# Patient Record
Sex: Female | Born: 1995 | Race: Black or African American | Hispanic: No | Marital: Single | Smoking: Current every day smoker
Health system: Southern US, Community
[De-identification: ages and names within clinical notes are randomized; demographics above are authoritative.]

---

## 2019-01-25 ENCOUNTER — Encounter (HOSPITAL_COMMUNITY): Payer: Self-pay | Admitting: *Deleted

## 2019-01-25 ENCOUNTER — Emergency Department (HOSPITAL_COMMUNITY)
Admission: EM | Admit: 2019-01-25 | Discharge: 2019-01-25 | Disposition: A | Discharge status: Home / Self Care | Payer: Self-pay | Attending: Emergency Medicine | Admitting: Emergency Medicine

## 2019-01-25 ENCOUNTER — Other Ambulatory Visit: Payer: Self-pay

## 2019-01-25 DIAGNOSIS — F17210 Nicotine dependence, cigarettes, uncomplicated: Secondary | ICD-10-CM | POA: Insufficient documentation

## 2019-01-25 DIAGNOSIS — T50901A Poisoning by unspecified drugs, medicaments and biological substances, accidental (unintentional), initial encounter: Secondary | ICD-10-CM | POA: Insufficient documentation

## 2019-01-25 DIAGNOSIS — F1210 Cannabis abuse, uncomplicated: Secondary | ICD-10-CM | POA: Insufficient documentation

## 2019-01-25 NOTE — ED Triage Notes (Signed)
Pt reports accidentally pouring her soda in a cup with antifreeze and drinking 2 sips of it today between 1500-1530.  She reports very mild abd pain without n/v.  She also endorses feeling "foggy."  She is A&Ox 4, in NAD.

## 2019-01-25 NOTE — ED Provider Notes (Signed)
[facility]-EMERGENCY DEPT Provider Note   CSN: [id number] Arrival date & time: 01/25/19  1645    History   Chief Complaint Chief Complaint  Patient presents with  . Ingestion    HPI Mary Ballard is a 23 y.o. female.     The history is provided by the patient.  Ingestion  This is a new problem. The current episode started 3 to 5 hours ago. The problem occurs rarely. The problem has been resolved. Pertinent negatives include no chest pain, no abdominal pain, no headaches and no shortness of breath. Associated symptoms comments: Accidentally may have drank soda with antifreeze in it. Took two sips of soda out of a cup that was used to fill antifreeze earlier in the day. . Nothing aggravates the symptoms. Nothing relieves the symptoms.    History reviewed. No pertinent past medical history.  There are no active problems to display for this patient.   History reviewed. No pertinent surgical history.   OB History   No obstetric history on file.      Home Medications    Prior to Admission medications   Not on File    Family History No family history on file.  Social History Social History   Tobacco Use  . Smoking status: Current Every Day Smoker    Packs/day: 0.50    Types: Cigarettes  . Smokeless tobacco: Never Used  Substance Use Topics  . Alcohol use: Yes    Comment: occasionally  . Drug use: Yes    Types: Marijuana    Comment: sometimes ecstasy     Allergies   Patient has no known allergies.   Review of Systems Review of Systems  Constitutional: Negative for chills and fever.  HENT: Negative for ear pain and sore throat.   Eyes: Negative for pain and visual disturbance.  Respiratory: Negative for cough and shortness of breath.   Cardiovascular: Negative for chest pain and palpitations.  Gastrointestinal: Negative for abdominal pain and vomiting.  Genitourinary: Negative for dysuria and hematuria.  Musculoskeletal:  Negative for arthralgias and back pain.  Skin: Negative for color change and rash.  Neurological: Negative for seizures, syncope and headaches.  All other systems reviewed and are negative.    Physical Exam Updated Vital Signs BP 114/80 (BP Location: Right Arm)   Pulse (!) 103   Temp 99.9 F (37.7 C) (Oral)   Resp 17   LMP 01/13/2019   SpO2 100%   Physical Exam Vitals signs and nursing note reviewed.  Constitutional:      General: She is not in acute distress.    Appearance: She is well-developed. She is not ill-appearing.  HENT:     Head: Normocephalic and atraumatic.     Nose: Nose normal.     Mouth/Throat:     Mouth: Mucous membranes are moist.  Eyes:     Extraocular Movements: Extraocular movements intact.     Conjunctiva/sclera: Conjunctivae normal.     Pupils: Pupils are equal, round, and reactive to light.  Neck:     Musculoskeletal: Normal range of motion and neck supple.  Cardiovascular:     Rate and Rhythm: Normal rate and regular rhythm.     Pulses: Normal pulses.     Heart sounds: Normal heart sounds. No murmur.  Pulmonary:     Effort: Pulmonary effort is normal. No respiratory distress.     Breath sounds: Normal breath sounds.  Abdominal:     General: Abdomen is flat.  Palpations: Abdomen is soft.     Tenderness: There is no abdominal tenderness.  Musculoskeletal: Normal range of motion.  Skin:    General: Skin is warm and dry.     Capillary Refill: Capillary refill takes less than 2 seconds.  Neurological:     General: No focal deficit present.     Mental Status: She is alert.     Cranial Nerves: No cranial nerve deficit.     Sensory: No sensory deficit.     Motor: No weakness.     Coordination: Coordination normal.     Gait: Gait normal.  Psychiatric:        Mood and Affect: Mood normal.      ED Treatments / Results  Labs (all labs ordered are listed, but only abnormal results are displayed) Labs Reviewed - No data to  display  EKG None  Radiology No results found.  Procedures Procedures (including critical care time)  Medications Ordered in ED Medications - No data to display   Initial Impression / Assessment and Plan / ED Course  I have reviewed the triage vital signs and the nursing notes.  Pertinent labs & imaging results that were available during my care of the patient were reviewed by me and considered in my medical decision making (see chart for details).     Mary Ballard is a 23 year old female with no significant medical history presents the ED after accidental ingestion of ethylene glycol based antifreeze.  Patient with overall unremarkable vitals.  No fever.  Patient states that she accidentally drank antifreeze that was ethylene glycol base.  Her boyfriend had used her soda cup to fill antifreeze earlier in the day.  She states that she used this cup to drink soda and noticed that it tasted bad. Total amount of soda poured in was about 20 oz. Patient did not notice that there was that much residual fluid in the cup prior to filling up her soda.  She states that she took 2 small sips of this mixture and noticed that it tasted different.  She confirmed with her boyfriend that he had used a cup to fill antifreeze earlier in the day.  Patient has no symptoms.  Overall the amount of the ingestion appears very minimal. Occurred several hours ago.  Patient likely drank about 2 to 3 cc of soda mixture with remnant antifreeze which is extremely unlikely to cause any issues.Talked with poison control who also agrees with this assessment.  Pt given reassurance and discharged home.  This chart was dictated using voice recognition software.  Despite best efforts to proofread,  errors can occur which can change the documentation meaning.    Final Clinical Impressions(s) / ED Diagnoses   Final diagnoses:  Accidental drug ingestion, initial encounter    ED Discharge Orders    None        Virgina Norfolk, DO 01/25/19 1821

## 2019-06-18 ENCOUNTER — Emergency Department (HOSPITAL_COMMUNITY)
Admission: EM | Admit: 2019-06-18 | Discharge: 2019-06-18 | Disposition: A | Discharge status: Home / Self Care | Payer: Medicaid Other | Attending: Emergency Medicine | Admitting: Emergency Medicine

## 2019-06-18 ENCOUNTER — Other Ambulatory Visit: Payer: Self-pay

## 2019-06-18 ENCOUNTER — Emergency Department (HOSPITAL_COMMUNITY): Payer: Medicaid Other

## 2019-06-18 ENCOUNTER — Encounter (HOSPITAL_COMMUNITY): Payer: Self-pay

## 2019-06-18 DIAGNOSIS — Z331 Pregnant state, incidental: Secondary | ICD-10-CM | POA: Diagnosis not present

## 2019-06-18 DIAGNOSIS — R350 Frequency of micturition: Secondary | ICD-10-CM | POA: Diagnosis not present

## 2019-06-18 DIAGNOSIS — Z3A01 Less than 8 weeks gestation of pregnancy: Secondary | ICD-10-CM

## 2019-06-18 DIAGNOSIS — F17210 Nicotine dependence, cigarettes, uncomplicated: Secondary | ICD-10-CM | POA: Diagnosis not present

## 2019-06-18 DIAGNOSIS — R1030 Lower abdominal pain, unspecified: Secondary | ICD-10-CM | POA: Diagnosis present

## 2019-06-18 LAB — CBC WITH DIFFERENTIAL/PLATELET
Abs Immature Granulocytes: 0.02 10*3/uL (ref 0.00–0.07)
Basophils Absolute: 0 10*3/uL (ref 0.0–0.1)
Basophils Relative: 0 %
Eosinophils Absolute: 0.1 10*3/uL (ref 0.0–0.5)
Eosinophils Relative: 1 %
HCT: 39.2 % (ref 36.0–46.0)
Hemoglobin: 12.6 g/dL (ref 12.0–15.0)
Immature Granulocytes: 0 %
Lymphocytes Relative: 28 %
Lymphs Abs: 2.7 10*3/uL (ref 0.7–4.0)
MCH: 29.9 pg (ref 26.0–34.0)
MCHC: 32.1 g/dL (ref 30.0–36.0)
MCV: 93.1 fL (ref 80.0–100.0)
Monocytes Absolute: 0.9 10*3/uL (ref 0.1–1.0)
Monocytes Relative: 9 %
Neutro Abs: 5.7 10*3/uL (ref 1.7–7.7)
Neutrophils Relative %: 62 %
Platelets: 283 10*3/uL (ref 150–400)
RBC: 4.21 MIL/uL (ref 3.87–5.11)
RDW: 13.9 % (ref 11.5–15.5)
WBC: 9.4 10*3/uL (ref 4.0–10.5)
nRBC: 0 % (ref 0.0–0.2)

## 2019-06-18 LAB — COMPREHENSIVE METABOLIC PANEL
ALT: 16 U/L (ref 0–44)
AST: 20 U/L (ref 15–41)
Albumin: 4.8 g/dL (ref 3.5–5.0)
Alkaline Phosphatase: 85 U/L (ref 38–126)
Anion gap: 9 (ref 5–15)
BUN: 8 mg/dL (ref 6–20)
CO2: 21 mmol/L — ABNORMAL LOW (ref 22–32)
Calcium: 9.4 mg/dL (ref 8.9–10.3)
Chloride: 104 mmol/L (ref 98–111)
Creatinine, Ser: 0.46 mg/dL (ref 0.44–1.00)
GFR calc Af Amer: >60 mL/min (ref >60)
GFR calc non Af Amer: >60 mL/min (ref >60)
Glucose, Bld: 74 mg/dL (ref 70–99)
Potassium: 3.5 mmol/L (ref 3.5–5.1)
Sodium: 134 mmol/L — ABNORMAL LOW (ref 135–145)
Total Bilirubin: 0.7 mg/dL (ref 0.3–1.2)
Total Protein: 8.3 g/dL — ABNORMAL HIGH (ref 6.5–8.1)

## 2019-06-18 LAB — URINALYSIS, ROUTINE W REFLEX MICROSCOPIC
Bilirubin Urine: NEGATIVE
Glucose, UA: NEGATIVE mg/dL
Hgb urine dipstick: NEGATIVE
Ketones, ur: 5 mg/dL — AB
Leukocytes,Ua: NEGATIVE
Nitrite: NEGATIVE
Protein, ur: NEGATIVE mg/dL
Specific Gravity, Urine: 1.021 (ref 1.005–1.030)
pH: 6 (ref 5.0–8.0)

## 2019-06-18 LAB — POC URINE PREG, ED: Preg Test, Ur: POSITIVE — AB

## 2019-06-18 LAB — HCG, QUANTITATIVE, PREGNANCY: hCG, Beta Chain, Quant, S: 13350 m[IU]/mL — ABNORMAL HIGH (ref <5)

## 2019-06-18 MED ORDER — CEPHALEXIN 500 MG PO CAPS: 500.0000 mg | ORAL_CAPSULE | Freq: Two times a day (BID) | ORAL | 0 refills | Status: AC

## 2019-06-18 MED ORDER — PRENATAL COMPLETE 14-0.4 MG PO TABS: 1.0000 | ORAL_TABLET | Freq: Every day | ORAL | 2 refills | Status: AC

## 2019-06-18 NOTE — Discharge Instructions (Addendum)
You had a positive pregnancy test with a confirmed intrauterine pregnancy.  Begin taking the prenatal vitamins daily. Follow-up at the [facility] health care clinic.  Call the number provided to set up an appointment.  Should you need to return to the emergency department due to worsening pain, vaginal bleeding, passing out, uncontrolled vomiting, or any other major concerns related to pregnancy, please proceed to the [facility] [place] may have symptoms of an urinary tract infection. Please take all of your antibiotics until finished!   You may develop abdominal discomfort or diarrhea from the antibiotic.  You may help offset this with probiotics which you can buy or get in yogurt. Do not eat or take the probiotics until 2 hours after your antibiotic.

## 2019-06-18 NOTE — ED Notes (Signed)
Pelvic Exam set up at bedside for provider to conduct when ready.

## 2019-06-18 NOTE — ED Provider Notes (Signed)
[facility] DEPT Provider Note   CSN: [id number] Arrival date & time: 06/18/19  1238    History   Chief Complaint Chief Complaint  Patient presents with  . Abdominal Pain  . Urinary Frequency    HPI Mary Ballard is a 23 y.o. female.     HPI   Mary Ballard is a 23 y.o. female, patient with no pertinent past medical history, presenting to the ED with abdominal pain for the last two weeks.  Pain is lower abdomen, feels like a pressure/pulling, intermittent every few hours, lasts for 5-10 minutes at a time, rates it 4-5/10, nonradiating. She has not experienced this before. Accompanied by urinary frequency, which she states may be due to UTI. One loose stool daily.  LMP June 18, usually regular.  Sexually active with multiple partners, both female and female.  She does not use any contraceptives.  Denies fever/chills, N/V, back/flank pain, vaginal discharge/bleeding, dysuria, hematuria, hematochezia/melena, or any other complaints.     History reviewed. No pertinent past medical history.  There are no active problems to display for this patient.   History reviewed. No pertinent surgical history.   OB History   No obstetric history on file.      Home Medications    Prior to Admission medications   Medication Sig Start Date End Date Taking? Authorizing Provider  acetaminophen (TYLENOL) 500 MG tablet Take 1,000 mg by mouth every 6 (six) hours as needed for headache.   Yes [provider]  diphenhydrAMINE (BENADRYL) 25 MG tablet Take 25 mg by mouth every 6 (six) hours as needed for allergies.   Yes [provider]  cephALEXin (KEFLEX) 500 MG capsule Take 1 capsule (500 mg total) by mouth 2 (two) times daily for 5 days. 06/18/19 06/23/19  Anyra Kaufman, Helane Gunther, PA-C  Prenatal Vit-Fe Fumarate-FA (PRENATAL COMPLETE) 14-0.4 MG TABS Take 1 tablet by mouth daily. 06/18/19   Ilya Neely, Helane Gunther, PA-C    Family History Family History   Problem Relation Age of Onset  . Healthy Mother   . Healthy Father     Social History Social History   Tobacco Use  . Smoking status: Current Every Day Smoker    Packs/day: 0.50    Types: Cigarettes  . Smokeless tobacco: Never Used  Substance Use Topics  . Alcohol use: Yes    Comment: occasionally  . Drug use: Yes    Types: Marijuana    Comment: sometimes ecstasy     Allergies   Patient has no known allergies.   Review of Systems Review of Systems  Constitutional: Negative for chills and fever.  Respiratory: Negative for cough and shortness of breath.   Cardiovascular: Negative for chest pain.  Gastrointestinal: Positive for abdominal pain. Negative for blood in stool, constipation, diarrhea, nausea and vomiting.  Genitourinary: Positive for frequency. Negative for dysuria, flank pain, hematuria, vaginal bleeding and vaginal discharge.  Musculoskeletal: Negative for back pain.  All other systems reviewed and are negative.    Physical Exam Updated Vital Signs BP 124/80 (BP Location: Left Arm)   Pulse 96   Temp 98.2 F (36.8 C) (Oral)   Resp 18   Ht 5\' 3"  (1.6 m)   Wt 52.6 kg   LMP 05/11/2019   SpO2 100%   BMI 20.55 kg/m   Physical Exam Vitals signs and nursing note reviewed.  Constitutional:      General: She is not in acute distress.    Appearance: She is well-developed. She is  not diaphoretic.  HENT:     Head: Normocephalic and atraumatic.     Mouth/Throat:     Mouth: Mucous membranes are moist.     Pharynx: Oropharynx is clear.  Eyes:     Conjunctiva/sclera: Conjunctivae normal.  Neck:     Musculoskeletal: Neck supple.  Cardiovascular:     Rate and Rhythm: Normal rate and regular rhythm.     Pulses: Normal pulses.          Radial pulses are 2+ on the right side and 2+ on the left side.       Posterior tibial pulses are 2+ on the right side and 2+ on the left side.     Heart sounds: Normal heart sounds.     Comments: Tactile temperature in the  extremities appropriate and equal bilaterally. Pulmonary:     Effort: Pulmonary effort is normal. No respiratory distress.     Breath sounds: Normal breath sounds.  Abdominal:     Palpations: Abdomen is soft.     Tenderness: There is no abdominal tenderness. There is no guarding.     Comments: No abdominal tenderness whatsoever.  Musculoskeletal:     Right lower leg: No edema.     Left lower leg: No edema.  Lymphadenopathy:     Cervical: No cervical adenopathy.  Skin:    General: Skin is warm and dry.  Neurological:     Mental Status: She is alert.  Psychiatric:        Mood and Affect: Mood and affect normal.        Speech: Speech normal.        Behavior: Behavior normal.      ED Treatments / Results  Labs (all labs ordered are listed, but only abnormal results are displayed) Labs Reviewed  URINALYSIS, ROUTINE W REFLEX MICROSCOPIC - Abnormal; Notable for the following components:      Result Value   Ketones, ur 5 (*)    All other components within normal limits  HCG, QUANTITATIVE, PREGNANCY - Abnormal; Notable for the following components:   hCG, Beta Chain, Quant, S 13,350 (*)    All other components within normal limits  COMPREHENSIVE METABOLIC PANEL - Abnormal; Notable for the following components:   Sodium 134 (*)    CO2 21 (*)    Total Protein 8.3 (*)    All other components within normal limits  POC URINE PREG, ED - Abnormal; Notable for the following components:   Preg Test, Ur POSITIVE (*)    All other components within normal limits  URINE CULTURE  CBC WITH DIFFERENTIAL/PLATELET  RPR  HIV ANTIBODY (ROUTINE TESTING W REFLEX)    EKG None  Radiology [place] Ob Comp < 14 Wks  Result Date: 06/18/2019 CLINICAL DATA:  LOWER abdominal pain for 2 weeks. [place] [address]. LMP is 05/11/2019. By LMP patient is 5 weeks 3 days. EDC by LMP is 02/15/2020 EXAM: OBSTETRIC <14 WK US AND TRANSVAGINAL OB US TECHNIQUE: Both transabdominal and transvaginal  ultrasound examinations were performed for complete evaluation of the gestation as well as the maternal uterus, adnexal regions, and pelvic cul-de-sac. Transvaginal technique was performed to assess early pregnancy. COMPARISON:  None. FINDINGS: Intrauterine gestational sac: Single Yolk sac:  Visualized. Embryo:  Not Visualized. Cardiac Activity: Not Visualized. Heart Rate: Absent bpm MSD: 5.6 mm   5 w   5 d Subchorionic hemorrhage: Small subchorionic hemorrhage is 1.3 x 1.0 x 0.7 centimeters Maternal uterus/adnexae: RIGHT ovary is normal in appearance,  2.2 x 1.9 x 1.6 centimeters. LEFT ovary contains a corpus luteum cyst measuring 2.2 x 2.1 x 1.8 centimeters. Also within the LEFT ovary there is a homogeneously hyperechoic mass measuring 0.7 x 0.7 x 0.8 centimeters. No calcifications identified within the LEFT ovary. No free pelvic fluid. IMPRESSION: Intrauterine gestational sac is present. Embryo is not yet visualized. Follow-up ultrasound is recommended in 14 or more days to document presence of fetal pole and for dating purposes. LEFT ovarian echogenic mass, consistent with small dermoid. LEFT corpus luteum cyst. Small subchorionic hemorrhage. Electronically Signed   By: Norva PavlovElizabeth  Brown M.D.   On: 06/18/2019 15:42   [place] Ob Transvaginal  Result Date: 06/18/2019 CLINICAL DATA:  LOWER abdominal pain for 2 weeks. Quantitative beta HCG is 13350. LMP is 05/11/2019. By LMP patient is 5 weeks 3 days. EDC by LMP is 02/15/2020 EXAM: OBSTETRIC <14 WK US AND TRANSVAGINAL OB US TECHNIQUE: Both transabdominal and transvaginal ultrasound examinations were performed for complete evaluation of the gestation as well as the maternal uterus, adnexal regions, and pelvic cul-de-sac. Transvaginal technique was performed to assess early pregnancy. COMPARISON:  None. FINDINGS: Intrauterine gestational sac: Single Yolk sac:  Visualized. Embryo:  Not Visualized. Cardiac Activity: Not Visualized. Heart Rate: Absent bpm MSD: 5.6 mm   5 w    5 d Subchorionic hemorrhage: Small subchorionic hemorrhage is 1.3 x 1.0 x 0.7 centimeters Maternal uterus/adnexae: RIGHT ovary is normal in appearance, 2.2 x 1.9 x 1.6 centimeters. LEFT ovary contains a corpus luteum cyst measuring 2.2 x 2.1 x 1.8 centimeters. Also within the LEFT ovary there is a homogeneously hyperechoic mass measuring 0.7 x 0.7 x 0.8 centimeters. No calcifications identified within the LEFT ovary. No free pelvic fluid. IMPRESSION: Intrauterine gestational sac is present. Embryo is not yet visualized. Follow-up ultrasound is recommended in 14 or more days to document presence of fetal pole and for dating purposes. LEFT ovarian echogenic mass, consistent with small dermoid. LEFT corpus luteum cyst. Small subchorionic hemorrhage. Electronically Signed   By: Norva PavlovElizabeth  Brown M.D.   On: 06/18/2019 15:42    Procedures Procedures (including critical care time)  Medications Ordered in ED Medications - No data to display   Initial Impression / Assessment and Plan / ED Course  I have reviewed the triage vital signs and the nursing notes.  Pertinent labs & imaging results that were available during my care of the patient were reviewed by me and considered in my medical decision making (see chart for details).  Clinical Course as of Jun 17 1728  Sun Jun 18, 2019  1706 Spoke with Donia AstNicole Nugent, NP at [facility].  Discussed patient's presentation as well as imaging and lab findings.  Agrees with plan for treatment of possible UTI as well as close follow-up in the clinic.   [SJ]    Clinical Course User Index [SJ] Hazem Kenner C, PA-C       Patient presents with lower abdominal discomfort and urinary frequency for the past 2 weeks.  Symptoms have not been worsening. Patient is nontoxic appearing, afebrile, not tachycardic, not tachypneic, not hypotensive, maintains excellent SPO2 on room air, and is in no apparent distress.  Initial and repeat abdominal exams benign.  Pelvic exam  discussed and purpose was explained to the patient, patient ultimately declined. Patient does have a positive pregnancy test today and this was discussed with her.   She has evidence of intrauterine pregnancy on ultrasound. Patient's UA with mild ketonuria, may be due to some mild dehydration.  There are no obvious signs of UTI on the UA, however, given the patient's symptoms and her positive pregnancy test, we will initiate antibiotic therapy for possible UTI.  Patient will follow-up with OB/GYN on an outpatient basis. The patient was given instructions for home care as well as return precautions. Patient voices understanding of these instructions, accepts the plan, and is comfortable with discharge.    Final Clinical Impressions(s) / ED Diagnoses   Final diagnoses:  Less than [redacted] weeks gestation of pregnancy    ED Discharge Orders         Ordered    Prenatal Vit-Fe Fumarate-FA (PRENATAL COMPLETE) 14-0.4 MG TABS  Daily     06/18/19 1710    cephALEXin (KEFLEX) 500 MG capsule  2 times daily     06/18/19 [address]1710           Azaria Stegman C, PA-C 06/18/19 1729    Jacalyn LefevreHaviland, Julie, MD 06/19/19 1525

## 2019-06-18 NOTE — ED Triage Notes (Signed)
Patient c/o lower abdominal pain and urinary frequency x 2 weeks. Patient denies any dysuria or vaginal discharge.

## 2019-06-18 NOTE — ED Notes (Signed)
Patient providing urine sample.

## 2019-06-19 LAB — RPR: RPR Ser Ql: NONREACTIVE

## 2019-06-19 LAB — HIV ANTIBODY (ROUTINE TESTING W REFLEX): HIV Screen 4th Generation wRfx: NONREACTIVE

## 2019-06-19 LAB — URINE CULTURE: Culture: NO GROWTH

## 2020-01-10 IMAGING — US OBSTETRIC <14 WK ULTRASOUND
1 series · 13 of 28 positions shown · non-contrast
Comparison: None.

CLINICAL DATA: LOWER abdominal pain for 2 weeks. Quantitative beta
EDC by LMP is 02/15/2020

EXAM:
OBSTETRIC <14 WK US AND TRANSVAGINAL OB US
TECHNIQUE: Both transabdominal and transvaginal ultrasound examinations were
performed for complete evaluation of the gestation as well as the
maternal uterus, adnexal regions, and pelvic cul-de-sac.
Transvaginal technique was performed to assess early pregnancy.

[Series 1: obstetric <14 wk ultrasound · 209 acquisitions, 13 frames shown]
[im 8/209]
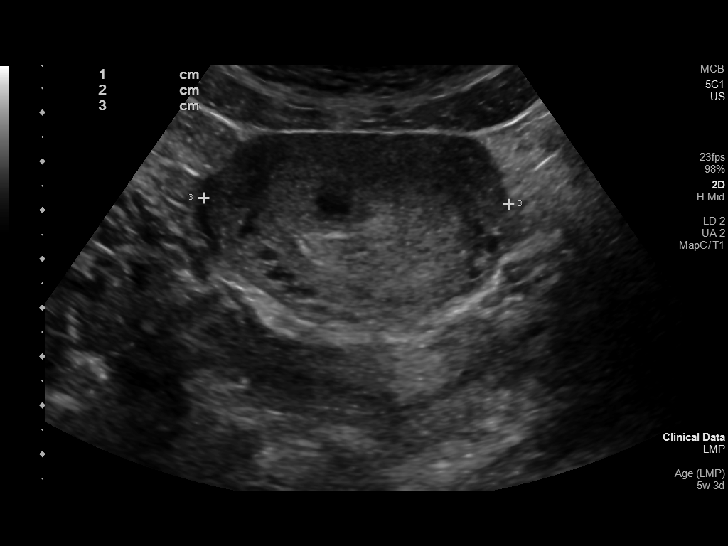
[im 24/209]
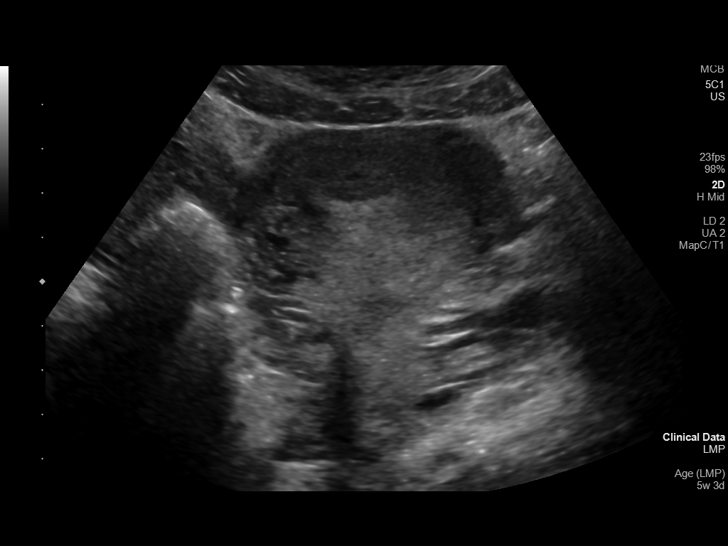
[im 39/209]
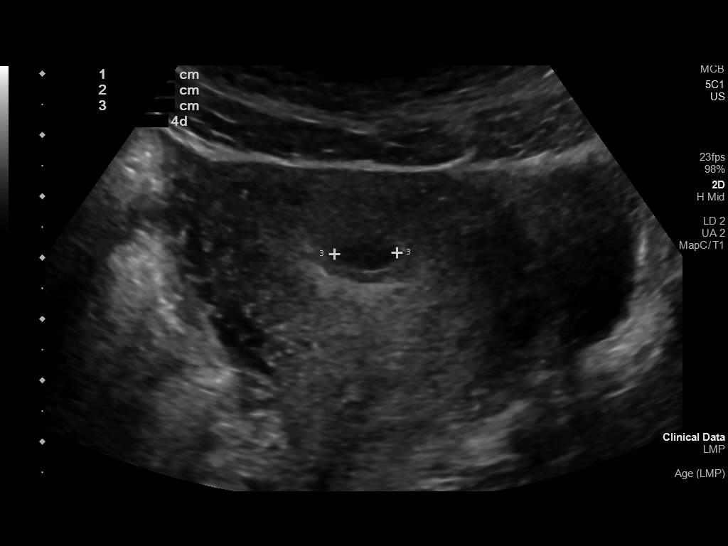
[im 54/209]
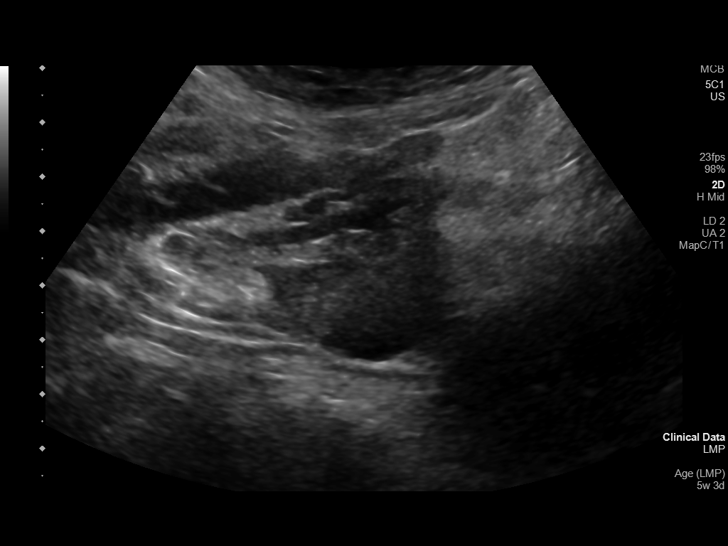
[im 70/209]
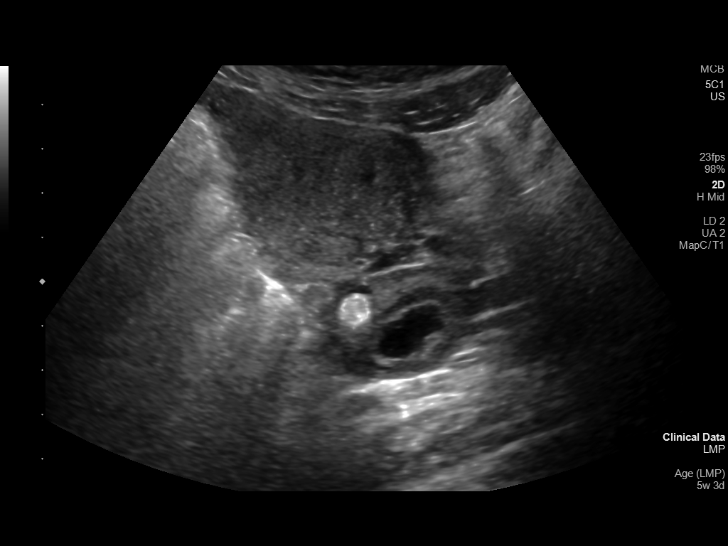
[im 85/209]
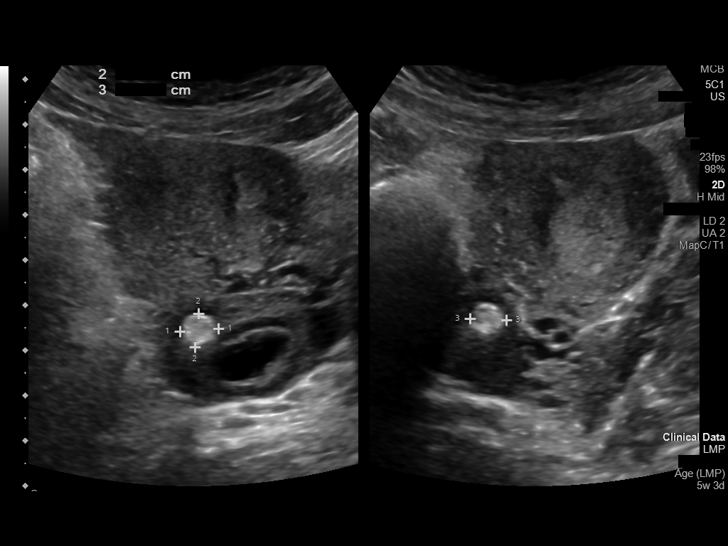
[im 108/209]
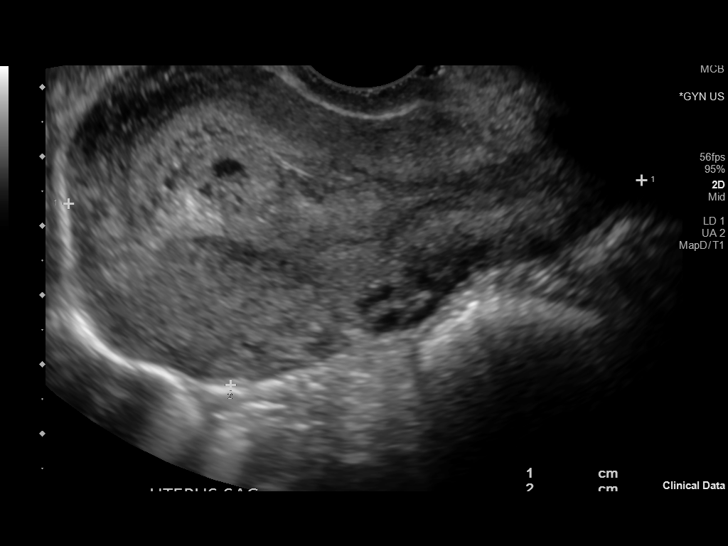
[im 124/209]
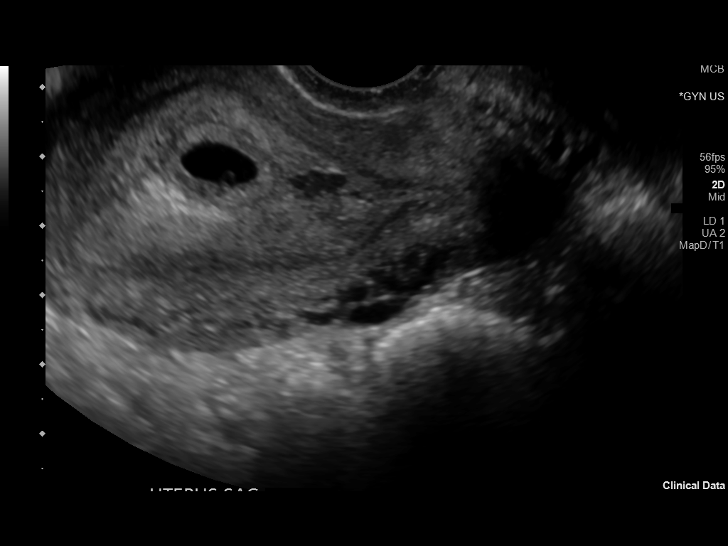
[im 139/209]
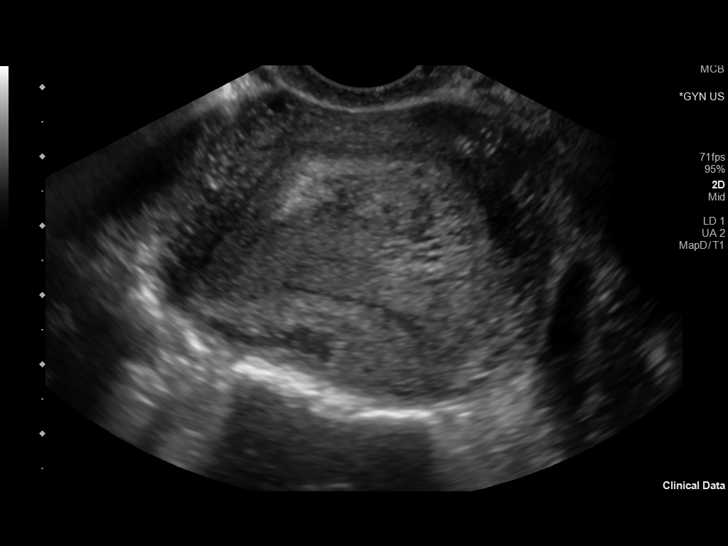
[im 155/209]
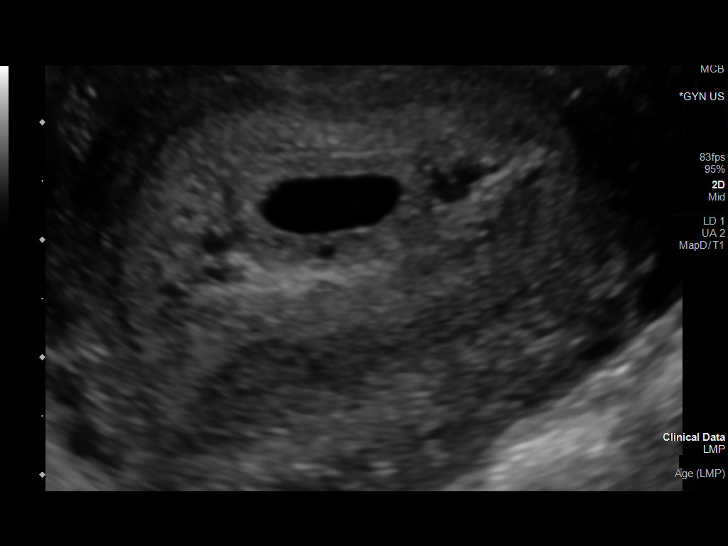
[im 170/209]
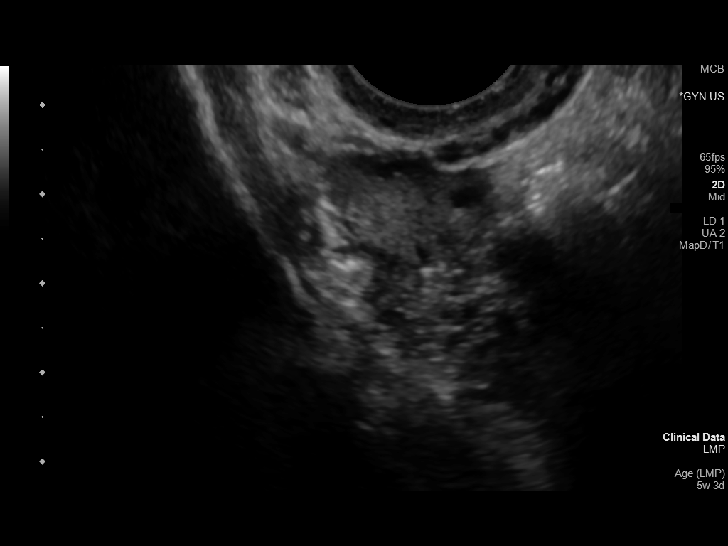
[im 185/209]
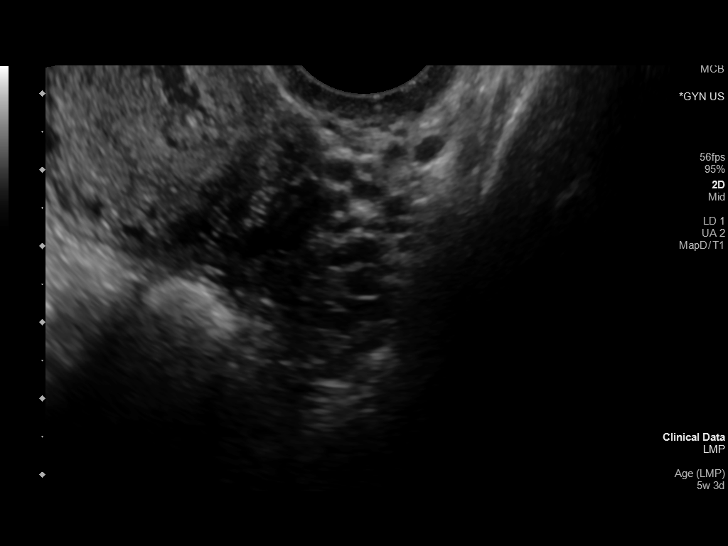
[im 201/209]
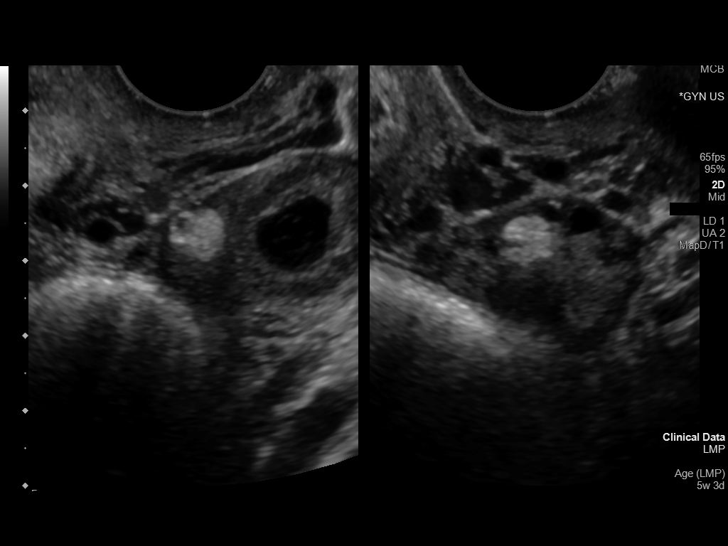

[13 of 28 positions shown; findings below may reference images not displayed]

FINDINGS: Intrauterine gestational sac: Single

Yolk sac:  Visualized.

Embryo:  Not Visualized.

Cardiac Activity: Not Visualized.

Heart Rate: Absent bpm

MSD: 5.6 mm   5 w   5 d

Subchorionic hemorrhage: Small subchorionic hemorrhage is 1.3 x
x 0.7 centimeters

Maternal uterus/adnexae: RIGHT ovary is normal in appearance, 2.2 x
1.9 x 1.6 centimeters. LEFT ovary contains a corpus luteum cyst
measuring 2.2 x 2.1 x 1.8 centimeters. Also within the LEFT ovary
there is a homogeneously hyperechoic mass measuring 0.7 x 0.7 x
centimeters. No calcifications identified within the LEFT ovary.

No free pelvic fluid.
IMPRESSION: Intrauterine gestational sac is present. Embryo is not yet
visualized. Follow-up ultrasound is recommended in 14 or more days
to document presence of fetal pole and for dating purposes.

LEFT ovarian echogenic mass, consistent with small dermoid.

LEFT corpus luteum cyst.

Small subchorionic hemorrhage.
